# Patient Record
Sex: Female | Born: 1995 | Race: Asian | Hispanic: No | Marital: Single | Smoking: Never smoker
Health system: Southern US, Community
[De-identification: ages and names within clinical notes are randomized; demographics above are authoritative.]

## PROBLEM LIST (undated history)

## (undated) DIAGNOSIS — Z789 Other specified health status: Secondary | ICD-10-CM

## (undated) HISTORY — PX: OTHER SURGICAL HISTORY: SHX169

## (undated) HISTORY — DX: Other specified health status: Z78.9

---

## 2019-11-22 ENCOUNTER — Ambulatory Visit: Payer: Self-pay | Attending: Internal Medicine

## 2019-11-22 ENCOUNTER — Emergency Department
Admission: EM | Admit: 2019-11-22 | Discharge: 2019-11-22 | Disposition: A | Discharge status: Home / Self Care | Payer: Self-pay | Attending: Emergency Medicine | Admitting: Emergency Medicine

## 2019-11-22 ENCOUNTER — Other Ambulatory Visit: Payer: Self-pay

## 2019-11-22 DIAGNOSIS — R002 Palpitations: Secondary | ICD-10-CM | POA: Insufficient documentation

## 2019-11-22 DIAGNOSIS — R21 Rash and other nonspecific skin eruption: Secondary | ICD-10-CM | POA: Insufficient documentation

## 2019-11-22 DIAGNOSIS — T50Z95A Adverse effect of other vaccines and biological substances, initial encounter: Secondary | ICD-10-CM | POA: Insufficient documentation

## 2019-11-22 DIAGNOSIS — R0602 Shortness of breath: Secondary | ICD-10-CM | POA: Insufficient documentation

## 2019-11-22 DIAGNOSIS — Z23 Encounter for immunization: Secondary | ICD-10-CM

## 2019-11-22 DIAGNOSIS — T881XXA Other complications following immunization, not elsewhere classified, initial encounter: Secondary | ICD-10-CM

## 2019-11-22 NOTE — ED Triage Notes (Signed)
First RN Note: Pt presents to ED via [facility] from Covid vaccine clinic. Per EMS pt had 1st covid vaccine dose earlier today. Per EMS pt had palpitations, rash to chest, and SOB. Per EMS pt given 25mg  Benadryl PO prior to EMS arrival. EMS reports lung sounds clear, no respiratory distress, and rash is resolving, EMS also reports per patient chest tightness and SOB resolving after PO benadryl as well.    117/64 79 98% RA CBG 141   20G to R AC

## 2019-11-22 NOTE — ED Provider Notes (Signed)
[facility] Emergency Department Provider Note  ____________________________________________  Time seen: Approximately 5:23 PM  I have reviewed the triage vital signs and the nursing notes.   HISTORY  Chief Complaint Allergic Reaction    HPI Janice Thompson is a 24 y.o. female that presents to the emergency department with concerns of reaction to Covid vaccine today. Patient received Pzfizer vaccination. Ten minutes after, she felt hot, developed a rash to her chest, became SOB, and felt like her heart was racing.HR was 106 at that time. She was given 25mg  benadryl at vaccination location. She was transported to ER by EMS. Patient states that her symptoms resolved after EMS arrival. Patient is tired now but denies any additional symptoms.     History reviewed. No pertinent past medical history.  There are no problems to display for this patient.   History reviewed. No pertinent surgical history.  Prior to Admission medications   Not on File    Allergies Patient has no known allergies.  No family history on file.  Social History Social History   Tobacco Use  . Smoking status: Never Smoker  Substance Use Topics  . Alcohol use: Not Currently  . Drug use: Not on file     Review of Systems  Constitutional: No fever/chills ENT: No upper respiratory complaints. Cardiovascular: No current chest pain. Respiratory: No cough. No current SOB. Gastrointestinal: No abdominal pain.  No nausea, no vomiting.  Musculoskeletal: Negative for musculoskeletal pain. Skin: Negative for current rash, abrasions, lacerations, ecchymosis. Neurological: Negative for headaches   ____________________________________________   PHYSICAL EXAM:  VITAL SIGNS: ED Triage Vitals [11/22/19 1552]  Enc Vitals Group     BP 109/75     Pulse Rate 77     Resp 18     Temp 98.2 F (36.8 C)     Temp Source Oral     SpO2 100 %     Weight 88 lb 2.9 oz (40 kg)     Height 5'  (1.524 m)     Head Circumference      Peak Flow      Pain Score 0     Pain Loc      Pain Edu?      Excl. in GC?      Constitutional: Alert and oriented. Well appearing and in no acute distress. Eyes: Conjunctivae are normal. PERRL. EOMI. Head: Atraumatic. ENT:      Ears:      Nose: No congestion/rhinnorhea.      Mouth/Throat: Mucous membranes are moist.  Neck: No stridor.   Cardiovascular: Normal rate, regular rhythm.  Good peripheral circulation. Respiratory: Normal respiratory effort without tachypnea or retractions. Lungs CTAB. Good air entry to the bases with no decreased or absent breath sounds. Musculoskeletal: Full range of motion to all extremities. No gross deformities appreciated. Neurologic:  Normal speech and language. No gross focal neurologic deficits are appreciated.  Skin:  Skin is warm, dry and intact. No rash noted. Psychiatric: Mood and affect are normal. Speech and behavior are normal. Patient exhibits appropriate insight and judgement.   ____________________________________________   LABS (all labs ordered are listed, but only abnormal results are displayed)  Labs Reviewed - No data to display ____________________________________________  EKG   ____________________________________________  RADIOLOGY   No results found.  ____________________________________________    PROCEDURES  Procedure(s) performed:    Procedures    Medications - No data to display   ____________________________________________   INITIAL IMPRESSION / ASSESSMENT AND PLAN /  ED COURSE  Pertinent labs & imaging results that were available during my care of the patient were reviewed by me and considered in my medical decision making (see chart for details).  Review of the [place] CSRS was performed in accordance of the [place] prior to dispensing any controlled drugs.   Patient presented to the ER for evaluation of reaction to Covid 19 vaccination. Patient was observed  in the ER for 1.5 hours without any symptoms.  Patient feels like resolved.  No shortness of breath, chest pain, nausea, vomiting, abdominal pain.  Patient is to follow up with PCP as directed. Patient is given ED precautions to return to the ED for any worsening or new symptoms.   Janice Thompson was evaluated in Emergency Department on 11/22/2019 for the symptoms described in the history of present illness. She was evaluated in the context of the global COVID-19 pandemic, which necessitated consideration that the patient might be at risk for infection with the SARS-CoV-2 virus that causes COVID-19. Institutional protocols and algorithms that pertain to the evaluation of patients at risk for COVID-19 are in a state of rapid change based on information released by regulatory bodies including the CDC and federal and state organizations. These policies and algorithms were followed during the patient's care in the ED.  ____________________________________________  FINAL CLINICAL IMPRESSION(S) / ED DIAGNOSES  Final diagnoses:  Vaccination complication, initial encounter      NEW MEDICATIONS STARTED DURING THIS VISIT:  ED Discharge Orders    None          This chart was dictated using voice recognition software/Dragon. Despite best efforts to proofread, errors can occur which can change the meaning. Any change was purely unintentional.    Laban Emperor, PA-C 11/22/19 2058    Earleen Newport, MD 11/22/19 [phone]

## 2019-11-22 NOTE — ED Triage Notes (Signed)
Pt reports she got her first COVID shot today at 245PM, while in observation began feeling heart race and became SOB. Pt states HR was 106 at that time. Given 25mg  PO Benadryl at vaccination clinic with resolution of sx. Pt reports mild drowsiness currently. Has never taken benadryl before. Pt alert and oriented X4, cooperative, RR even and unlabored, color WNL. Pt in NAD. 20G to L AC started by EMS

## 2019-11-22 NOTE — Progress Notes (Signed)
   Covid-19 Vaccination Clinic  Name:  Janice Thompson    MRN: [id number] DOB: May 20, 1996  11/22/2019  Janice Thompson was observed post Covid-19 immunization for 15 minutes .  During the observation period, she experienced an adverse reaction with the following symptoms:  difficulty breathing, hives and rapid heart rate.  Assessment : Time of assessment 1453. Alert and oriented, Unlabored breathing, Tachycardia, Diaphoretic and Hives,  Location:  chest, neck, upperback.  Actions taken:  EMS called at  1457. vital signs At 2:56pm  132/90 HR 116 o2 sat 100%, reassed at 2:59pm 125/74  HR 106 o2 sat 995    Medications administered: Diphenhydramine (Benadryl) 12.5 ml (25 mg) by mouth. Time: 1459 and 1504. Administered by Lowry Ram.  Disposition:Patient evaluated and transferred to the hospital for further evaluation and care. Time of transport: 1508.   Immunizations Administered    Name Date Dose VIS Date Route   Pfizer COVID-19 Vaccine 11/22/2019  2:41 PM 0.3 mL 08/04/2019 Intramuscular   Manufacturer: [employer], [employer]   Lot: [id number]   NDC: [address]-0

## 2019-12-17 ENCOUNTER — Ambulatory Visit: Payer: Self-pay | Attending: Internal Medicine

## 2019-12-17 DIAGNOSIS — Z23 Encounter for immunization: Secondary | ICD-10-CM

## 2019-12-17 NOTE — Progress Notes (Signed)
   Covid-19 Vaccination Clinic  Name:  Janice Thompson    MRN: [id number] DOB: May 12, 1996  12/17/2019  [name] was observed post Covid-19 immunization for 15 minutes without incident. She was provided with Vaccine Information Sheet and instruction to access the V-Safe system.   [name] was instructed to call 911 with any severe reactions post vaccine: Marland Kitchen Difficulty breathing  . Swelling of face and throat  . A fast heartbeat  . A bad rash all over body  . Dizziness and weakness   Immunizations Administered    Name Date Dose VIS Date Route   Pfizer COVID-19 Vaccine 12/17/2019  9:43 AM 0.3 mL 10/18/2018 Intramuscular   Manufacturer: [employer], [employer]   Lot: [id number]   NDC: [address]-6

## 2021-02-05 ENCOUNTER — Ambulatory Visit (LOCAL_COMMUNITY_HEALTH_CENTER): Payer: BC Managed Care – PPO

## 2021-02-05 ENCOUNTER — Other Ambulatory Visit: Payer: Self-pay

## 2021-02-05 DIAGNOSIS — Z23 Encounter for immunization: Secondary | ICD-10-CM | POA: Diagnosis not present

## 2021-02-05 NOTE — Progress Notes (Signed)
In Nurse Clinic for vaccines as required for college at [employer]. States she moved from [place] 2018 and presents childhood vaccine record from [place]. [facility] updated with these vaccines. Eligible for Hep B and Tdap  and these vaccines were given today. Tolerated well and [facility] updated. Questions answered. Jerel Shepherd, RN

## 2021-02-11 ENCOUNTER — Other Ambulatory Visit: Payer: Self-pay

## 2021-02-11 ENCOUNTER — Ambulatory Visit (LOCAL_COMMUNITY_HEALTH_CENTER): Payer: BC Managed Care – PPO

## 2021-02-11 DIAGNOSIS — Z111 Encounter for screening for respiratory tuberculosis: Secondary | ICD-10-CM

## 2021-02-14 ENCOUNTER — Other Ambulatory Visit: Payer: Self-pay

## 2021-02-14 ENCOUNTER — Ambulatory Visit (LOCAL_COMMUNITY_HEALTH_CENTER): Payer: BC Managed Care – PPO

## 2021-02-14 VITALS — Wt 95.0 lb

## 2021-02-14 DIAGNOSIS — R7611 Nonspecific reaction to tuberculin skin test without active tuberculosis: Secondary | ICD-10-CM

## 2021-02-14 LAB — TB SKIN TEST
Induration: 15 mm
TB Skin Test: POSITIVE

## 2021-02-14 NOTE — Progress Notes (Signed)
Counseled regarding significance of positive TB skin test, differences between active TB disease versus LTBI and recommendation for CXR. Discussed TLTBI and client unsure at this time if desires. Given the following:   LTBI versus Active TB Disease info sheet, Important Information About Rifampin, Richmond Campbell RN, TB Coordinator card and CXR referral form.   Client aware will be notified of CXR and lab results via clinic.Jossie Ng, RN

## 2021-02-20 ENCOUNTER — Telehealth: Payer: Self-pay

## 2021-02-20 DIAGNOSIS — Z227 Latent tuberculosis: Secondary | ICD-10-CM

## 2021-02-25 ENCOUNTER — Ambulatory Visit
Admission: RE | Admit: 2021-02-25 | Discharge: 2021-02-25 | Disposition: A | Discharge status: Home / Self Care | Payer: Self-pay | Source: Ambulatory Visit | Attending: Family Medicine | Admitting: Family Medicine

## 2021-02-25 ENCOUNTER — Ambulatory Visit
Admission: RE | Admit: 2021-02-25 | Discharge: 2021-02-25 | Disposition: A | Discharge status: Home / Self Care | Payer: Self-pay | Attending: Family Medicine | Admitting: Family Medicine

## 2021-02-25 DIAGNOSIS — R7611 Nonspecific reaction to tuberculin skin test without active tuberculosis: Secondary | ICD-10-CM | POA: Insufficient documentation

## 2021-03-04 DIAGNOSIS — Z227 Latent tuberculosis: Secondary | ICD-10-CM | POA: Insufficient documentation

## 2021-03-04 NOTE — Telephone Encounter (Signed)
TC from mother/pt.  Informed CXR without active TB. Patient needs copy of CXR and TB screen form.  Plans to stop by [facility] tomorrow for these documents. Richmond Campbell, RN

## 2022-04-14 IMAGING — CR DG CHEST 1V
1 series · 1 of 1 positions shown · non-contrast
Comparison: No prior.

CLINICAL DATA: Positive PPD.

EXAM:
CHEST  1 VIEW

[dg chest 1 view]
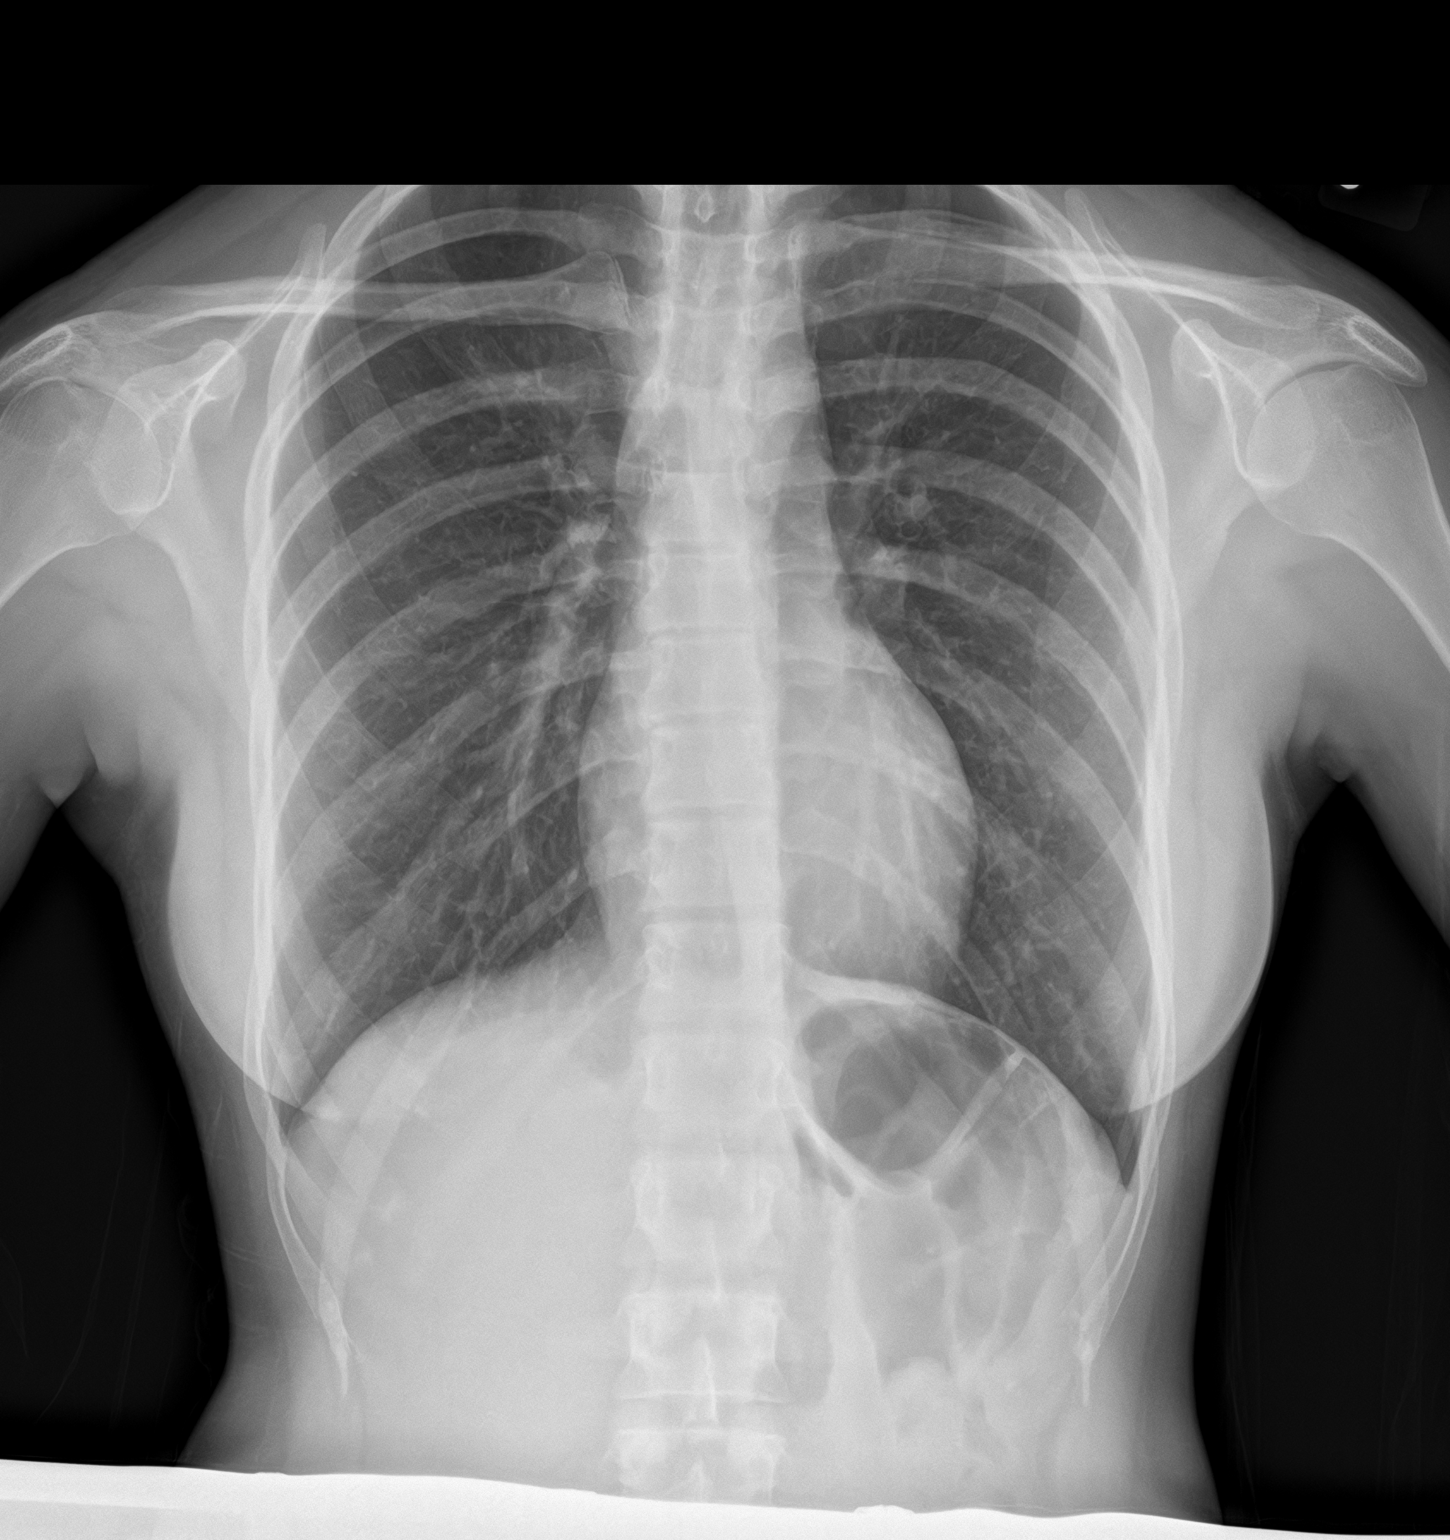

[1 of 1 positions shown; findings below may reference images not displayed]

FINDINGS: Mediastinum hilar structures normal. Lungs are clear. No pleural
effusion or pneumothorax. Heart size normal. No acute bony
abnormality. Mild thoracic spine scoliosis.
IMPRESSION: No acute cardiopulmonary disease. No radiographic evidence of active
TB.

## 2022-10-08 NOTE — Progress Notes (Signed)
Initial neurology clinic note  SERVICE DATE: 10/15/22  Reason for Evaluation: Consultation requested by Alexis Frock, PA for an opinion regarding head pain and left upper extremity numbness. My final recommendations will be communicated back to the requesting physician by way of shared medical record or letter to requesting physician via [place] mail.  HPI: This is Ms. LOUNETTE Thompson, a 27 y.o. right-handed female with a medical history of intestinal volvulus surgery at 76 days old who presents to neurology clinic with the chief complaint of head pain and left upper extremity numbness. The patient is alone today.  Patient first noticed symptoms in 2017. She was ice skating and collided with another skater. She was having headaches at that time. She was told she may have a concussion. Symptoms did not improve. Patient had a trip and fall over her bed frame in 2018 and hit her face. Since that fall, she has a sharp pain from inside in her left ear to head to neck. It is a pressure sensation (tension). It is constant, and does not come and go for the last few years. She has constant photophobia and phonophobia, but denies nausea or vomiting. Prior to 2017, she had never had significant headache. Occasionally she will get a painful headache and will Alleve (maybe once every 2 months).  She had an episode in 06/2022 where she was in class, froze up, had difficulty breathing, and had numbness of the left arm. It lasted about 5 minutes. She went to get checked out but was told everything was normal. She had another episode of her left arm going numb in early 09/2021. It felt weird holding things. She began to get worried and her heart was racing and breathing was difficult.  She sometimes wakes up and feels numb in her hands. Her sister mentioned she would sometimes wake up when she was younger and scream, then go back to bed. She denies bed wetting or losing control of bowel or bladder. She denies any  episodes of loss of consciousness.  She has never had seizures, including febrile seizures as a child. She has never had a brain infection. There is no history of developmental delay.  She was seen at [facility] and there was thought this could be cervical radiculopathy. Patient was prescribed prednisone for 5 days, gabapentin 100 mg BID for 5 days but she took neither.  She also mentions similar symptoms after her COVID vaccine in 2021 (difficulty breathing, heart racing).  Patient noticed some personality changes around 06/2018. She has never been an angry person, but she started getting "overwhelmed", fatigue, having angry outbursts over small things. She felt like she did not want to be around people. She saw her PCP who felt it could be a menstrual issue. Since then, symptoms may have improved a little, but she will notice when she is not busy and distracted she will be more likely to have symptoms. She will find herself getting sad and hopeless. For years, she mentioned she avoiding doing things that she used enjoy. She states that she "generally feels like a different person." She does endorse occasional suicidal thoughts, but not currently, and she has never had a plan.  The patient has not noticed any recent skin rashes nor does she report any constitutional symptoms like fever, night sweats, anorexia or unintentional weight loss.  EtOH use: Rare, once per month  Restrictive diet? No Family history of neuropathy/myopathy/neurology disease? Grandmother had epilepsy  MEDICATIONS:  No outpatient encounter medications  on file as of 10/15/2022.   No facility-administered encounter medications on file as of 10/15/2022.    PAST MEDICAL HISTORY: Past Medical History:  Diagnosis Date   No pertinent past medical history     PAST SURGICAL HISTORY: Past Surgical History:  Procedure Laterality Date   Repair of twisted intestine at age 63 days.      ALLERGIES: No Known Allergies  FAMILY  HISTORY: Family History  Problem Relation Age of Onset   Thyroid disease Mother    Thyroid disease Father     SOCIAL HISTORY: Social History   Tobacco Use   Smoking status: Never   Smokeless tobacco: Never  Vaping Use   Vaping Use: Never used  Substance Use Topics   Alcohol use: Not Currently    Comment: Last ETOH use 12/2020.   Drug use: Never   Social History   Social History Narrative   Are you right handed or left handed? right   Are you currently employed ? 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 parents   What type of home do you live in: 1 story or 2 story? one   Caffeine 2-3 cup tea      OBJECTIVE: PHYSICAL EXAM: BP 103/70   Pulse 68   Ht 5' (1.524 m)   Wt 98 lb (44.5 kg)   SpO2 98%   BMI 19.14 kg/m   General: General appearance: Awake and alert. No distress. Cooperative with exam.  Skin: No obvious rash or jaundice. HEENT: Atraumatic. Anicteric. Fundus examination with clear disc margins with no evidence of papilledema. Lungs: Non-labored breathing on room air  Heart: Regular Extremities: No edema. No obvious deformity.  Musculoskeletal: No obvious joint swelling. Psych: Affect appropriate.  Neurological: Mental Status: Alert. Speech fluent. No pseudobulbar affect Cranial Nerves: CNII: No RAPD. Visual fields grossly intact. CNIII, IV, VI: PERRL. No nystagmus. EOMI. CN V: Facial sensation intact bilaterally to fine touch CN VII: Facial muscles symmetric and strong. No ptosis at rest . CN VIII: Hearing grossly intact bilaterally. CN IX: No hypophonia. CN X: Palate elevates symmetrically. CN XI: Full strength shoulder shrug bilaterally. CN XII: Tongue protrusion full and midline. No atrophy or fasciculations. No significant dysarthria Motor: Tone is normal.  Individual muscle group testing (MRC grade out of 5):  Movement     Neck flexion 5    Neck extension 5      Right Left   Shoulder abduction 5 5   Shoulder adduction 5 5   Elbow flexion 5 5   Elbow extension 5 5   Finger abduction - FDI 5 5   Finger abduction - ADM 5 5   Finger extension 5 5   Finger distal flexion - 2/3 5 5   $ Finger distal flexion - 4/5 5 5   $ Thumb flexion - FPL 5 5   Thumb abduction - APB 5 5    Hip flexion 5 5   Hip extension 5 5   Hip adduction 5 5   Hip abduction 5 5   Knee extension 5 5   Knee flexion 5 5   Dorsiflexion 5 5   Plantarflexion 5 5    Reflexes:  Right Left   Bicep 2+ 2+   Tricep 2+ 2+   BrRad 2+ 2+   Knee 2+ 2+   Ankle 2+ 2+    Pathological Reflexes: Hoffman: absent  bilaterally Troemner: absent bilaterally Sensation: Pinprick: Intact in all extremities Coordination: Intact finger-to- nose-finger bilaterally. Romberg negative. Gait: Able to rise from chair with arms crossed unassisted. Normal, narrow-based gait. Able to tandem walk. Able to walk on toes and heels.  Lab and Test Review: External labs: HbA1c (03/18/18): 5.2 TSH (03/18/18): 1.778 CBC and CMP (03/18/09): unremarkable  ASSESSMENT: ABISOLA PFEFFER is a 27 y.o. female who presents for evaluation of left sided head pain/pressure and transient, intermittent numbness of left arm with shortness of breath and palpitations. She has no relevant medical history. Her neurological examination is essentially normal today. The etiology of patient's symptoms is currently unclear. Her symptoms started after a fall on ice, so could be post-traumatic in nature. Her personality changes (increased emotions and anger) could be related to post-concussive like symptoms. The stereotyped episodes could be concerning for seizure, which could result from head trauma. Demyelinating disease is also possible, though less likely. Her mood symptoms, with SI, is concerning for depression. She is not currently depressed and denies current SI, but this should be evaluated and treated, if appropriate.  PLAN: -Blood  work: B1, B12, vit D, TSH -MRI brain and cervical spine w/wo -Will consider EEG after MRI brain -Behavioral health referral for depression and SI like symptoms  -Return to clinic in 3 months  The impression above as well as the plan as outlined below were extensively discussed with the patient who voiced understanding. All questions were answered to their satisfaction.  When available, results of the above investigations and possible further recommendations will be communicated to the patient via telephone/MyChart. Patient to call office if not contacted after expected testing turnaround time.   Total time spent reviewing records, interview, history/exam, documentation, and coordination of care on day of encounter:  50 min   Thank you for allowing me to participate in patient's care.  If I can answer any additional questions, I would be pleased to do so.  [name], MD   CC: Pcp, No No address on file  CC: Referring provider: Alexis Frock, [place] [address], [place] [address] [place],  [place] [address]

## 2022-10-15 ENCOUNTER — Encounter: Payer: Self-pay | Admitting: Neurology

## 2022-10-15 ENCOUNTER — Other Ambulatory Visit (INDEPENDENT_AMBULATORY_CARE_PROVIDER_SITE_OTHER): Payer: BC Managed Care – PPO

## 2022-10-15 ENCOUNTER — Telehealth: Payer: Self-pay | Admitting: Neurology

## 2022-10-15 ENCOUNTER — Ambulatory Visit: Payer: BC Managed Care – PPO | Admitting: Neurology

## 2022-10-15 VITALS — BP 103/70 | HR 68 | Ht 60.0 in | Wt 98.0 lb

## 2022-10-15 DIAGNOSIS — R45851 Suicidal ideations: Secondary | ICD-10-CM

## 2022-10-15 DIAGNOSIS — S0990XS Unspecified injury of head, sequela: Secondary | ICD-10-CM | POA: Diagnosis not present

## 2022-10-15 DIAGNOSIS — R519 Headache, unspecified: Secondary | ICD-10-CM

## 2022-10-15 DIAGNOSIS — R5383 Other fatigue: Secondary | ICD-10-CM

## 2022-10-15 DIAGNOSIS — R29818 Other symptoms and signs involving the nervous system: Secondary | ICD-10-CM | POA: Diagnosis not present

## 2022-10-15 DIAGNOSIS — F32A Depression, unspecified: Secondary | ICD-10-CM

## 2022-10-15 DIAGNOSIS — F688 Other specified disorders of adult personality and behavior: Secondary | ICD-10-CM

## 2022-10-15 LAB — VITAMIN D 25 HYDROXY (VIT D DEFICIENCY, FRACTURES): VITD: 16.34 ng/mL — ABNORMAL LOW (ref 30.00–100.00)

## 2022-10-15 LAB — VITAMIN B12: Vitamin B-12: 113 pg/mL — ABNORMAL LOW (ref 211–911)

## 2022-10-15 LAB — TSH: TSH: 2.11 u[IU]/mL (ref 0.35–5.50)

## 2022-10-15 NOTE — Patient Instructions (Signed)
I would like to investigate your symptoms further with the following: -Blood work today -MRI of your brain and cervical spine  We may consider EEG (brain wave testing) depending on the results of the MRI.  I am referring you to behavioral health for your depression symptoms.  You should get a call for the MRI and behavioral health scheduling. If you do not hear from anyone in 1-2 weeks, please let us know.  I will be in touch when I have your results.  I would like to see you back in clinic in about 3 months. Please let me know if you have any questions or concerns in the meantime. Please let me know if you have new or worsening symptoms.  The physicians and staff at [facility] are committed to providing excellent care. You may receive a survey requesting feedback about your experience at our office. We strive to receive "very good" responses to the survey questions. If you feel that your experience would prevent you from giving the office a "very good " response, please contact our office to try to remedy the situation. We may be reached at [phone]. Thank you for taking the time out of your busy day to complete the survey.  [name], MD [facility]

## 2022-10-15 NOTE — Telephone Encounter (Signed)
Called patient to discuss abnormal labs. Her B12 and Vit D were low. I recommended the following: -Vit D supplement 1000 IU daily -B12 1000 mcg daily  All questions were answered.  [name], MD [facility]

## 2022-10-21 LAB — VITAMIN B1: Vitamin B1 (Thiamine): 9 nmol/L (ref 8–30)

## 2022-10-21 NOTE — Telephone Encounter (Signed)
Patient is returning phone call.  °

## 2022-10-22 ENCOUNTER — Encounter: Payer: Self-pay | Admitting: Neurology

## 2022-10-26 NOTE — Telephone Encounter (Signed)
Patient is calling in wondering if someone is able to return her call about lab results.

## 2022-10-27 NOTE — Telephone Encounter (Signed)
Called pt and left message to call office for the 4th time. We just keep missing each other. Can't leave a message .

## 2022-11-03 ENCOUNTER — Telehealth: Payer: Self-pay

## 2022-11-03 ENCOUNTER — Other Ambulatory Visit: Payer: BC Managed Care – PPO

## 2022-11-03 NOTE — Telephone Encounter (Signed)
done

## 2023-01-08 ENCOUNTER — Ambulatory Visit (INDEPENDENT_AMBULATORY_CARE_PROVIDER_SITE_OTHER)
Admission: RE | Admit: 2023-01-08 | Discharge: 2023-01-08 | Disposition: A | Discharge status: Home / Self Care | Payer: BC Managed Care – PPO | Source: Ambulatory Visit | Attending: Family | Admitting: Family

## 2023-01-08 ENCOUNTER — Encounter: Payer: Self-pay | Admitting: Family

## 2023-01-08 ENCOUNTER — Ambulatory Visit: Payer: BC Managed Care – PPO | Admitting: Family

## 2023-01-08 VITALS — BP 104/72 | HR 81 | Resp 18 | Ht 60.0 in | Wt 96.2 lb

## 2023-01-08 DIAGNOSIS — R5383 Other fatigue: Secondary | ICD-10-CM

## 2023-01-08 DIAGNOSIS — M542 Cervicalgia: Secondary | ICD-10-CM | POA: Diagnosis not present

## 2023-01-08 DIAGNOSIS — E559 Vitamin D deficiency, unspecified: Secondary | ICD-10-CM | POA: Diagnosis not present

## 2023-01-08 DIAGNOSIS — Z124 Encounter for screening for malignant neoplasm of cervix: Secondary | ICD-10-CM

## 2023-01-08 DIAGNOSIS — R7989 Other specified abnormal findings of blood chemistry: Secondary | ICD-10-CM

## 2023-01-08 DIAGNOSIS — R29898 Other symptoms and signs involving the musculoskeletal system: Secondary | ICD-10-CM

## 2023-01-08 LAB — CBC WITH DIFFERENTIAL/PLATELET
Basophils Absolute: 0.1 10*3/uL (ref 0.0–0.1)
Basophils Relative: 0.7 % (ref 0.0–3.0)
Eosinophils Absolute: 0.1 10*3/uL (ref 0.0–0.7)
Eosinophils Relative: 1.8 % (ref 0.0–5.0)
HCT: 39.6 % (ref 36.0–46.0)
Hemoglobin: 13.5 g/dL (ref 12.0–15.0)
Lymphocytes Relative: 24.9 % (ref 12.0–46.0)
Lymphs Abs: 2 10*3/uL (ref 0.7–4.0)
MCHC: 34 g/dL (ref 30.0–36.0)
MCV: 88.8 fl (ref 78.0–100.0)
Monocytes Absolute: 0.6 10*3/uL (ref 0.1–1.0)
Monocytes Relative: 7.6 % (ref 3.0–12.0)
Neutro Abs: 5.1 10*3/uL (ref 1.4–7.7)
Neutrophils Relative %: 65 % (ref 43.0–77.0)
Platelets: 243 10*3/uL (ref 150.0–400.0)
RBC: 4.46 Mil/uL (ref 3.87–5.11)
RDW: 12.2 % (ref 11.5–15.5)
WBC: 7.9 10*3/uL (ref 4.0–10.5)

## 2023-01-08 LAB — COMPREHENSIVE METABOLIC PANEL
ALT: 12 U/L (ref 0–35)
AST: 13 U/L (ref 0–37)
Albumin: 4 g/dL (ref 3.5–5.2)
Alkaline Phosphatase: 54 U/L (ref 39–117)
BUN: 11 mg/dL (ref 6–23)
CO2: 26 mEq/L (ref 19–32)
Calcium: 9.3 mg/dL (ref 8.4–10.5)
Chloride: 104 mEq/L (ref 96–112)
Creatinine, Ser: 0.84 mg/dL (ref 0.40–1.20)
GFR: 96.82 mL/min (ref >60.00)
Glucose, Bld: 79 mg/dL (ref 70–99)
Potassium: 3.5 mEq/L (ref 3.5–5.1)
Sodium: 137 mEq/L (ref 135–145)
Total Bilirubin: 3 mg/dL — ABNORMAL HIGH (ref 0.2–1.2)
Total Protein: 6.9 g/dL (ref 6.0–8.3)

## 2023-01-08 LAB — VITAMIN B12: Vitamin B-12: 356 pg/mL (ref 211–911)

## 2023-01-08 LAB — VITAMIN D 25 HYDROXY (VIT D DEFICIENCY, FRACTURES): VITD: 20.32 ng/mL — ABNORMAL LOW (ref 30.00–100.00)

## 2023-01-08 NOTE — Progress Notes (Signed)
Janice Thompson is a 27 y.o. 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   No current facility-administered medications for this visit.    Allergies: Patient has no known allergies.  Past Medical History:  Diagnosis Date   No pertinent past medical history     Past Surgical History:  Procedure Laterality Date   Repair of twisted intestine at age 76 days.      Family History  Problem Relation Age of Onset   Thyroid disease Mother    Thyroid disease Father     Social History   Tobacco Use   Smoking status: Never   Smokeless tobacco: Never  Substance Use Topics   Alcohol use: Not Currently    Comment: Last ETOH use 12/2020.    Subjective:   Presents today as a new patient- chronic left sided headaches x 2 years; referred to neurology but unable to complete MRIs that were recommended due to cost; has follow up with neurology in about 2 weeks; in past 2 weeks, has noticed worsening clicking/ soreness over left jaw; no abnormalities noted at dentist in March but notes she is overdue to get a filling updated;   Has just graduated in [employer]- will be getting Masters in [employer]; starting this fall at [employer];    Objective:  Vitals:   01/08/23 1056  BP: 104/72  Pulse: 81  Resp: 18  SpO2: 99%  Weight: 96 lb 3.2 oz (43.6 kg)  Height: 5' (1.524 m)    General: Well developed, well nourished, in no acute distress  Skin : Warm and dry.  Head: Normocephalic and atraumatic  Eyes: Sclera and conjunctiva clear; pupils round and reactive to light; extraocular movements intact  Ears: External normal; canals clear; tympanic membranes normal  Oropharynx: Pink, supple. No suspicious lesions  Neck: Supple without thyromegaly, adenopathy  Lungs: Respirations unlabored; clear to auscultation bilaterally without wheeze, rales, rhonchi  CVS exam:  normal rate and regular rhythm.  Neurologic: Alert and oriented; speech intact; face symmetrical; moves all extremities well; CNII-XII intact without focal deficit   Assessment:  1. Neck pain   2. Cervical cancer screening   3. Other fatigue   4. Low vitamin B12 level   5. Vitamin D deficiency   6. TMJ click     Plan:  Update cervical X-ray; she will plan to keep follow up her neurologist for early June;  Refer to GYN to establish care; 3.   Update labs to follow up on low B12 and low Vitamin D; 6.   She defers medication at this time; she is encouraged to schedule a follow up with her dentist;   No follow-ups on file.  Orders Placed This Encounter  Procedures   DG Cervical Spine Complete    Standing Status:   Future    Standing Expiration Date:   07/11/2023    Order Specific Question:   Reason for Exam (SYMPTOM  OR DIAGNOSIS REQUIRED)    Answer:   neck pain    Order Specific Question:   Is the patient pregnant?    Answer:   No    Order Specific Question:   Preferred imaging location?    Answer:   [address]   B12   Vitamin D (25 hydroxy)   CBC with Differential/Platelet   Comp Met (CMET)   Ambulatory referral  to Obstetrics / Gynecology    Referral Priority:   Routine    Referral Type:   Consultation    Referral Reason:   Specialty Services Required    Requested Specialty:   Obstetrics and Gynecology    Number of Visits Requested:   1    Requested Prescriptions    No prescriptions requested or ordered in this encounter

## 2023-01-08 NOTE — Patient Instructions (Signed)
For the X-ray- go to [facility]; go to the basement; no appointment needed- open from 8 am- 5 pm;   Please call your dentist;

## 2023-01-11 ENCOUNTER — Other Ambulatory Visit: Payer: Self-pay | Admitting: Family

## 2023-01-11 ENCOUNTER — Telehealth: Payer: Self-pay

## 2023-01-11 DIAGNOSIS — R899 Unspecified abnormal finding in specimens from other organs, systems and tissues: Secondary | ICD-10-CM

## 2023-01-11 MED ORDER — VITAMIN D (ERGOCALCIFEROL) 1.25 MG (50000 UNIT) PO CAPS: 50000.0000 [IU] | ORAL_CAPSULE | ORAL | 0 refills | Status: AC

## 2023-01-11 NOTE — Telephone Encounter (Signed)
-----   Message from Olive Bass, FNP sent at 01/11/2023  1:31 PM EDT ----- She has 2 notes- see X-ray too when you call her please;  1) Vitamin D level is still lower than goal- I would like to have her take a prescriptive dosage weekly for the next 12 weeks.  2) One of her liver functions was up slightly- had she taken a lot of Tylenol prior to appointment? Let's re-check lab in about 1 week- no Tylenol or alcohol 2-3 days prior; lab appointment is fine.  3) B12 level has improved- keep taking supplement.

## 2023-01-11 NOTE — Telephone Encounter (Signed)
Spoke with pt, pt is aware of lab results as well as Xray results, pt expressed understanding. Scheduled pt a lab appt 01/19/2023. Pt was advised PT may help with some of the pain she is experiencing, pt stated she has an appt with a chiropractor 01/20/2023 pt would like to be know which would be more beneficial.

## 2023-01-12 NOTE — Telephone Encounter (Signed)
Spoke with pt, pt is aware and expressed understanding, pt is in agreement with care plan.

## 2023-01-13 ENCOUNTER — Telehealth: Payer: Self-pay | Admitting: Family

## 2023-01-13 NOTE — Telephone Encounter (Signed)
Pt said she is ready to move forward with Physical therapy. Pt would like referral to be sent to [facility] PT at [facility].

## 2023-01-14 ENCOUNTER — Other Ambulatory Visit: Payer: Self-pay | Admitting: Family

## 2023-01-14 DIAGNOSIS — G8929 Other chronic pain: Secondary | ICD-10-CM

## 2023-01-14 NOTE — Progress Notes (Deleted)
NEUROLOGY FOLLOW UP OFFICE NOTE  Janice Thompson [id number]  Subjective:  Janice Thompson is a 27 y.o. year old  right-handed female with a medical history of intestinal volvulus surgery at 67 days old who we last saw on 10/15/22.  To briefly review: Patient first noticed symptoms in 2017. She was ice skating and collided with another skater. She was having headaches at that time. She was told she may have a concussion. Symptoms did not improve. Patient had a trip and fall over her bed frame in 2018 and hit her face. Since that fall, she has a sharp pain from inside in her left ear to head to neck. It is a pressure sensation (tension). It is constant, and does not come and go for the last few years. She has constant photophobia and phonophobia, but denies nausea or vomiting. Prior to 2017, she had never had significant headache. Occasionally she will get a painful headache and will Alleve (maybe once every 2 months).   She had an episode in 06/2022 where she was in class, froze up, had difficulty breathing, and had numbness of the left arm. It lasted about 5 minutes. She went to get checked out but was told everything was normal. She had another episode of her left arm going numb in early 09/2021. It felt weird holding things. She began to get worried and her heart was racing and breathing was difficult.   She sometimes wakes up and feels numb in her hands. Her sister mentioned she would sometimes wake up when she was younger and scream, then go back to bed. She denies bed wetting or losing control of bowel or bladder. She denies any episodes of loss of consciousness.   She has never had seizures, including febrile seizures as a child. She has never had a brain infection. There is no history of developmental delay.   She was seen at [facility] and there was thought this could be cervical radiculopathy. Patient was prescribed prednisone for 5 days, gabapentin 100 mg BID for 5 days but she took neither.    She also mentions similar symptoms after her COVID vaccine in 2021 (difficulty breathing, heart racing).   Patient noticed some personality changes around 06/2018. She has never been an angry person, but she started getting "overwhelmed", fatigue, having angry outbursts over small things. She felt like she did not want to be around people. She saw her PCP who felt it could be a menstrual issue. Since then, symptoms may have improved a little, but she will notice when she is not busy and distracted she will be more likely to have symptoms. She will find herself getting sad and hopeless. For years, she mentioned she avoiding doing things that she used enjoy. She states that she "generally feels like a different person." She does endorse occasional suicidal thoughts, but not currently, and she has never had a plan.   The patient has not noticed any recent skin rashes nor does she report any constitutional symptoms like fever, night sweats, anorexia or unintentional weight loss.   EtOH use: Rare, once per month  Restrictive diet? No Family history of neuropathy/myopathy/neurology disease? Grandmother had epilepsy  Most recent Assessment and Plan (10/15/22): Janice Thompson is a 27 y.o. female who presents for evaluation of left sided head pain/pressure and transient, intermittent numbness of left arm with shortness of breath and palpitations. She has no relevant medical history. Her neurological examination is essentially normal today. The etiology of patient's symptoms  is currently unclear. Her symptoms started after a fall on ice, so could be post-traumatic in nature. Her personality changes (increased emotions and anger) could be related to post-concussive like symptoms. The stereotyped episodes could be concerning for seizure, which could result from head trauma. Demyelinating disease is also possible, though less likely. Her mood symptoms, with SI, is concerning for depression. She is not currently  depressed and denies current SI, but this should be evaluated and treated, if appropriate.   PLAN: -Blood work: B1, B12, vit D, TSH -MRI brain and cervical spine w/wo -Will consider EEG after MRI brain -Behavioral health referral for depression and SI like symptoms  Since their last visit: B12 and vit D levels were low. I recommended supplementation.*** Patient has not gotten her MRIs.***  Behavioral health?***  MEDICATIONS:  Outpatient Encounter Medications as of 01/28/2023  Medication Sig   Vitamin D, Ergocalciferol, (DRISDOL) 1.25 MG (50000 UNIT) CAPS capsule Take 1 capsule (50,000 Units total) by mouth every 7 (seven) days for 12 doses.   No facility-administered encounter medications on file as of 01/28/2023.    PAST MEDICAL HISTORY: Past Medical History:  Diagnosis Date   No pertinent past medical history     PAST SURGICAL HISTORY: Past Surgical History:  Procedure Laterality Date   Repair of twisted intestine at age 36 days.      ALLERGIES: No Known Allergies  FAMILY HISTORY: Family History  Problem Relation Age of Onset   Thyroid disease Mother    Thyroid disease Father     SOCIAL HISTORY: Social History   Tobacco Use   Smoking status: Never   Smokeless tobacco: Never  Vaping Use   Vaping Use: Never used  Substance Use Topics   Alcohol use: Not Currently    Comment: Last ETOH use 12/2020.   Drug use: Never   Social History   Social History Narrative   Are you right handed or left handed? right   Are you currently employed ? 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 parents   What type of home do you live in: 1 story or 2 story? one   Caffeine 2-3 cup tea       Objective:  Vital Signs:  LMP 01/08/2023 (Exact Date)   ***  Labs and Imaging review: New results: 10/15/22: TSH wnl B1 wnl Vit D: 16.34 B12: 113  B12 (01/08/23): 356 Vit D (01/08/23): 20.32  CMP  (01/08/23): Tbili 3.0  Previously reviewed results: HbA1c (03/18/18): 5.2 TSH (03/18/18): 1.778 CBC and CMP (03/18/09): unremarkable  Assessment/Plan:  This is Janice Thompson, a 27 y.o. female with: ***   Plan: ***  Return to clinic in ***  Total time spent reviewing records, interview, history/exam, documentation, and coordination of care on day of encounter:  *** min  Jacquelyne Balint, MD

## 2023-01-19 ENCOUNTER — Other Ambulatory Visit (INDEPENDENT_AMBULATORY_CARE_PROVIDER_SITE_OTHER): Payer: BC Managed Care – PPO

## 2023-01-19 DIAGNOSIS — R899 Unspecified abnormal finding in specimens from other organs, systems and tissues: Secondary | ICD-10-CM | POA: Diagnosis not present

## 2023-01-19 LAB — BILIRUBIN, TOTAL: Total Bilirubin: 1.3 mg/dL — ABNORMAL HIGH (ref 0.2–1.2)

## 2023-01-21 ENCOUNTER — Encounter: Payer: Self-pay | Admitting: Neurology

## 2023-01-28 ENCOUNTER — Ambulatory Visit: Payer: BC Managed Care – PPO | Admitting: Neurology

## 2023-02-23 ENCOUNTER — Ambulatory Visit (INDEPENDENT_AMBULATORY_CARE_PROVIDER_SITE_OTHER): Payer: BC Managed Care – PPO | Admitting: Medical

## 2023-02-23 ENCOUNTER — Other Ambulatory Visit (HOSPITAL_COMMUNITY)
Admission: RE | Admit: 2023-02-23 | Discharge: 2023-02-23 | Disposition: A | Discharge status: Home / Self Care | Payer: BC Managed Care – PPO | Source: Ambulatory Visit | Attending: Medical | Admitting: Medical

## 2023-02-23 ENCOUNTER — Encounter: Payer: Self-pay | Admitting: Medical

## 2023-02-23 VITALS — BP 99/60 | HR 75 | Ht 60.0 in | Wt 96.0 lb

## 2023-02-23 DIAGNOSIS — Z124 Encounter for screening for malignant neoplasm of cervix: Secondary | ICD-10-CM | POA: Insufficient documentation

## 2023-02-23 DIAGNOSIS — Z1339 Encounter for screening examination for other mental health and behavioral disorders: Secondary | ICD-10-CM | POA: Diagnosis not present

## 2023-02-23 DIAGNOSIS — Z01419 Encounter for gynecological examination (general) (routine) without abnormal findings: Secondary | ICD-10-CM

## 2023-02-23 NOTE — Progress Notes (Signed)
   History:  Ms. Janice Thompson is a 27 y.o. G0 who presents to clinic today for annual exam. Patient has never had a pap smear. Patient is not currently sexually active. Periods are regular lasting 4-5 days. She denies vaginal bleeding, discharge, UTI symptoms, GI issues or breast concerns today. She has no GYN concerns. LMP 02/11/23.   The following portions of the patient's history were reviewed and updated as appropriate: allergies, current medications, family history, past medical history, social history, past surgical history and problem list.  Review of Systems:  Review of Systems  Constitutional:  Negative for fever and malaise/fatigue.  Gastrointestinal:  Negative for abdominal pain, constipation, diarrhea, nausea and vomiting.  Genitourinary:  Negative for dysuria, frequency and urgency.       Neg - vaginal bleeding, discharge, pelvic pain      Objective:  Physical Exam BP 99/60   Pulse 75   Ht 5' (1.524 m)   Wt 96 lb (43.5 kg)   LMP 02/11/2023   BMI 18.75 kg/m  Physical Exam Vitals and nursing note reviewed. Exam conducted with a chaperone present.  Constitutional:      General: She is not in acute distress.    Appearance: Normal appearance. She is well-developed and normal weight.  HENT:     Head: Normocephalic and atraumatic.  Neck:     Thyroid: No thyromegaly.  Cardiovascular:     Rate and Rhythm: Normal rate and regular rhythm.     Heart sounds: No murmur heard. Pulmonary:     Effort: Pulmonary effort is normal. No respiratory distress.     Breath sounds: Normal breath sounds. No wheezing.  Abdominal:     General: Abdomen is flat. Bowel sounds are normal. There is no distension.     Palpations: Abdomen is soft. There is no mass.     Tenderness: There is no abdominal tenderness. There is no guarding or rebound.  Genitourinary:    General: Normal vulva.     Vagina: Vaginal discharge (mucous) present. No erythema, tenderness or bleeding.     Cervix: No  cervical motion tenderness, discharge, friability, lesion, erythema or cervical bleeding.  Musculoskeletal:     Cervical back: Neck supple.  Skin:    General: Skin is warm and dry.     Findings: No erythema.  Neurological:     Mental Status: She is alert and oriented to person, place, and time.  Psychiatric:        Mood and Affect: Mood normal.      Health Maintenance Due  Topic Date Due   HPV VACCINES (1 - 2-dose series) Never done   HIV Screening  Never done   Hepatitis C Screening  Never done   PAP-Cervical Cytology Screening  Never done   PAP SMEAR-Modifier  Never done   COVID-19 Vaccine (6 - 2023-24 season) 04/24/2022    Labs, imaging and previous visits in Epic and Care Everywhere reviewed  Assessment & Plan:  1. Women's annual routine gynecological examination - Pap smear today, advised if normal, next pap in 3 years  - Results via MyChart   Return in about 1 year (around 02/23/2024) for Annual exam. Or sooner PRN   Marny Lowenstein, PA-C 02/23/2023 9:55 AM

## 2023-02-24 LAB — CYTOLOGY - PAP: Diagnosis: NEGATIVE

## 2023-03-28 ENCOUNTER — Other Ambulatory Visit: Payer: Self-pay | Admitting: Family

## 2023-04-06 ENCOUNTER — Other Ambulatory Visit: Payer: Self-pay | Admitting: Internal Medicine

## 2023-04-06 DIAGNOSIS — M542 Cervicalgia: Secondary | ICD-10-CM

## 2023-04-25 ENCOUNTER — Ambulatory Visit
Admission: RE | Admit: 2023-04-25 | Discharge: 2023-04-25 | Disposition: A | Discharge status: Home / Self Care | Payer: BC Managed Care – PPO | Source: Ambulatory Visit | Attending: Internal Medicine | Admitting: Internal Medicine

## 2023-04-25 DIAGNOSIS — M542 Cervicalgia: Secondary | ICD-10-CM

## 2023-09-20 ENCOUNTER — Ambulatory Visit (LOCAL_COMMUNITY_HEALTH_CENTER): Payer: BC Managed Care – PPO

## 2023-09-20 DIAGNOSIS — Z111 Encounter for screening for respiratory tuberculosis: Secondary | ICD-10-CM

## 2023-09-20 NOTE — Progress Notes (Signed)
In nurse clinic for TB screening needed for college application. Hx of +PPD in 2022. TB screening form completed and given to patient. All questions answered and verbalizes understanding.   Abagail Kitchens, RN

## 2023-09-23 ENCOUNTER — Other Ambulatory Visit: Payer: BC Managed Care – PPO
